# Patient Record
Sex: Female | Born: 1960 | Race: White | Hispanic: No | Marital: Married | Smoking: Never smoker
Health system: Southern US, Community
[De-identification: ages and names within clinical notes are randomized; demographics above are authoritative.]

## PROBLEM LIST (undated history)

## (undated) DIAGNOSIS — N83209 Unspecified ovarian cyst, unspecified side: Secondary | ICD-10-CM

## (undated) HISTORY — DX: Unspecified ovarian cyst, unspecified side: N83.209

## (undated) HISTORY — PX: EYE SURGERY: SHX253

---

## 2020-02-02 ENCOUNTER — Emergency Department: Payer: BLUE CROSS/BLUE SHIELD

## 2020-02-02 ENCOUNTER — Inpatient Hospital Stay
Admission: EM | Admit: 2020-02-02 | Discharge: 2020-02-03 | DRG: 392 | Disposition: A | Discharge status: Home / Self Care | Payer: BLUE CROSS/BLUE SHIELD | Attending: Obstetrics and Gynecology | Admitting: Obstetrics and Gynecology

## 2020-02-02 ENCOUNTER — Encounter: Payer: Self-pay | Admitting: Emergency Medicine

## 2020-02-02 ENCOUNTER — Other Ambulatory Visit: Payer: Self-pay

## 2020-02-02 DIAGNOSIS — Z20822 Contact with and (suspected) exposure to covid-19: Secondary | ICD-10-CM | POA: Diagnosis present

## 2020-02-02 DIAGNOSIS — M545 Low back pain: Secondary | ICD-10-CM | POA: Diagnosis not present

## 2020-02-02 DIAGNOSIS — N83209 Unspecified ovarian cyst, unspecified side: Secondary | ICD-10-CM | POA: Diagnosis present

## 2020-02-02 DIAGNOSIS — R1010 Upper abdominal pain, unspecified: Secondary | ICD-10-CM | POA: Diagnosis present

## 2020-02-02 DIAGNOSIS — R140 Abdominal distension (gaseous): Secondary | ICD-10-CM

## 2020-02-02 DIAGNOSIS — R1900 Intra-abdominal and pelvic swelling, mass and lump, unspecified site: Principal | ICD-10-CM | POA: Diagnosis present

## 2020-02-02 DIAGNOSIS — R109 Unspecified abdominal pain: Secondary | ICD-10-CM | POA: Diagnosis present

## 2020-02-02 LAB — URINALYSIS, COMPLETE (UACMP) WITH MICROSCOPIC
Bacteria, UA: NONE SEEN
Bilirubin Urine: NEGATIVE
Glucose, UA: NEGATIVE mg/dL
Hgb urine dipstick: NEGATIVE
Ketones, ur: 20 mg/dL — AB
Leukocytes,Ua: NEGATIVE
Nitrite: NEGATIVE
Protein, ur: NEGATIVE mg/dL
Specific Gravity, Urine: 1.012 (ref 1.005–1.030)
pH: 6 (ref 5.0–8.0)

## 2020-02-02 LAB — COMPREHENSIVE METABOLIC PANEL
ALT: 12 U/L (ref 0–44)
AST: 17 U/L (ref 15–41)
Albumin: 3.9 g/dL (ref 3.5–5.0)
Alkaline Phosphatase: 53 U/L (ref 38–126)
Anion gap: 9 (ref 5–15)
BUN: 12 mg/dL (ref 6–20)
CO2: 26 mmol/L (ref 22–32)
Calcium: 8.8 mg/dL — ABNORMAL LOW (ref 8.9–10.3)
Chloride: 103 mmol/L (ref 98–111)
Creatinine, Ser: 0.8 mg/dL (ref 0.44–1.00)
GFR calc Af Amer: >60 mL/min (ref >60)
GFR calc non Af Amer: >60 mL/min (ref >60)
Glucose, Bld: 132 mg/dL — ABNORMAL HIGH (ref 70–99)
Potassium: 4.3 mmol/L (ref 3.5–5.1)
Sodium: 138 mmol/L (ref 135–145)
Total Bilirubin: 0.9 mg/dL (ref 0.3–1.2)
Total Protein: 7.4 g/dL (ref 6.5–8.1)

## 2020-02-02 LAB — CBC
HCT: 43.1 % (ref 36.0–46.0)
Hemoglobin: 13.8 g/dL (ref 12.0–15.0)
MCH: 26.1 pg (ref 26.0–34.0)
MCHC: 32 g/dL (ref 30.0–36.0)
MCV: 81.5 fL (ref 80.0–100.0)
Platelets: 271 10*3/uL (ref 150–400)
RBC: 5.29 MIL/uL — ABNORMAL HIGH (ref 3.87–5.11)
RDW: 13.4 % (ref 11.5–15.5)
WBC: 7.8 10*3/uL (ref 4.0–10.5)
nRBC: 0 % (ref 0.0–0.2)

## 2020-02-02 LAB — PROTIME-INR
INR: 1.1 (ref 0.8–1.2)
Prothrombin Time: 13.6 seconds (ref 11.4–15.2)

## 2020-02-02 LAB — LIPASE, BLOOD: Lipase: 23 U/L (ref 11–51)

## 2020-02-02 LAB — AMMONIA: Ammonia: <9 umol/L — ABNORMAL LOW (ref 9–35)

## 2020-02-02 IMAGING — CT CT ABD-PELV W/ CM
2 of 5 series · 13 of 46 positions shown, 15 images · IV contrast (APPLIED)
Comparison: None.
COMPARISON: None.

Addendum:
CLINICAL DATA: Hip JIM nodes battery knee seen at circle

EXAM:
CT ABDOMEN AND PELVIS WITH CONTRAST
TECHNIQUE: Multidetector CT imaging of the abdomen and pelvis was performed
using the standard protocol following bolus administration of
intravenous contrast.
CONTRAST:  125mL OMNIPAQUE IOHEXOL 300 MG/ML  SOLN

[Series 3: routine abd/pel with · axial · 1.27mm/px · z∈[-686,-171]mm · 10 of 117 slices shown, 12 images]
[im 7/117  soft-tissue]
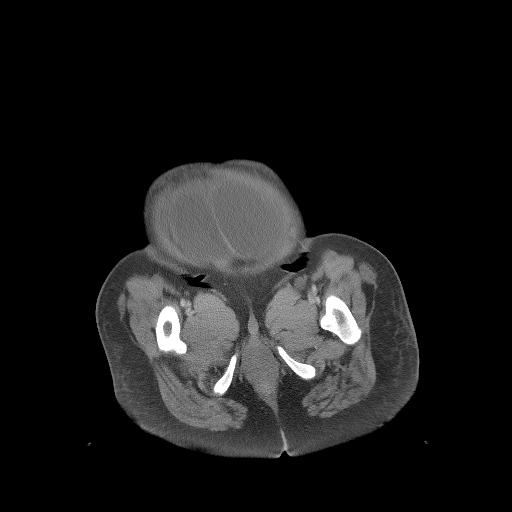
[im 7/117  bone]
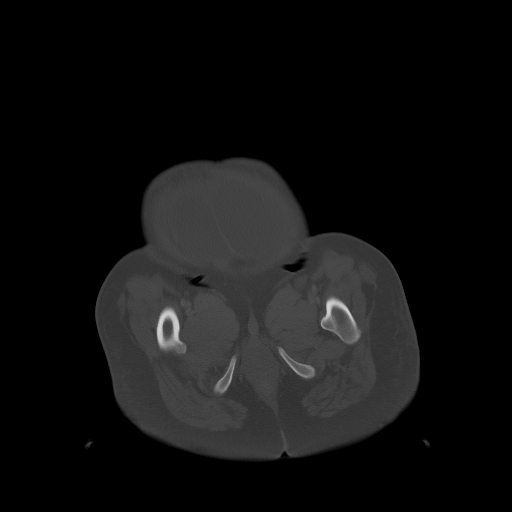
[im 19/117  soft-tissue]
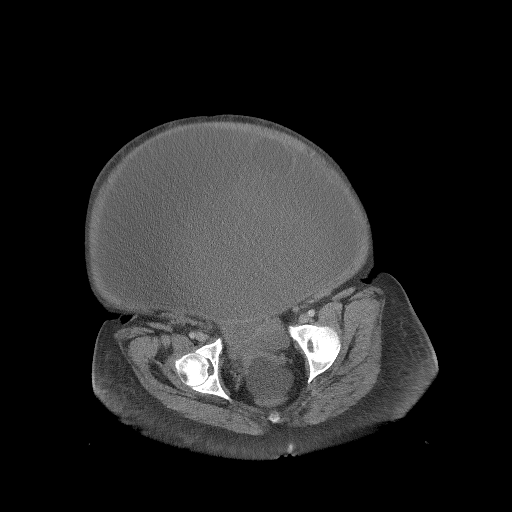
[im 31/117  soft-tissue]
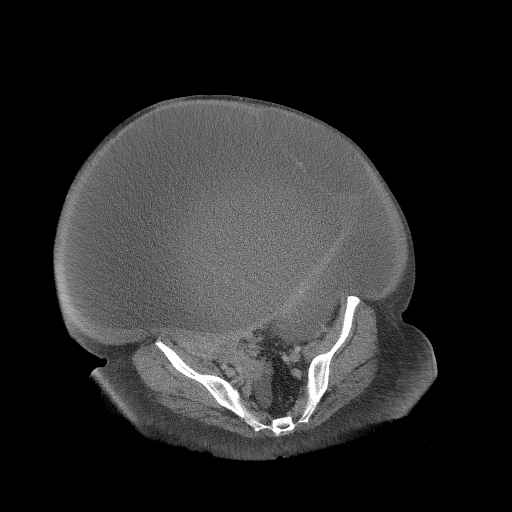
[im 43/117  soft-tissue]
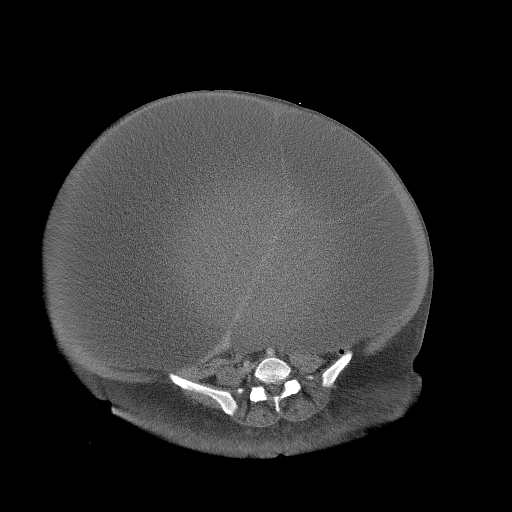
[im 55/117  soft-tissue]
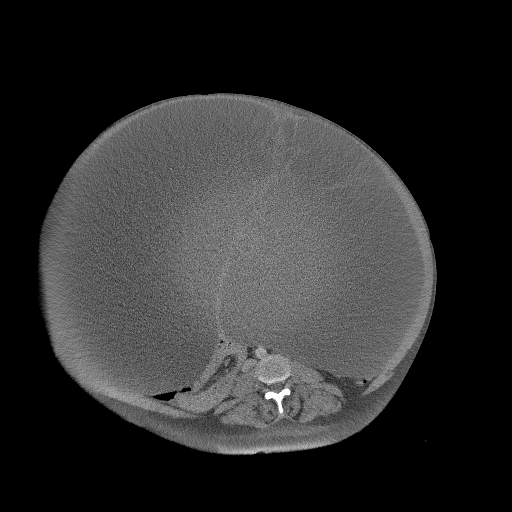
[im 62/117  soft-tissue]
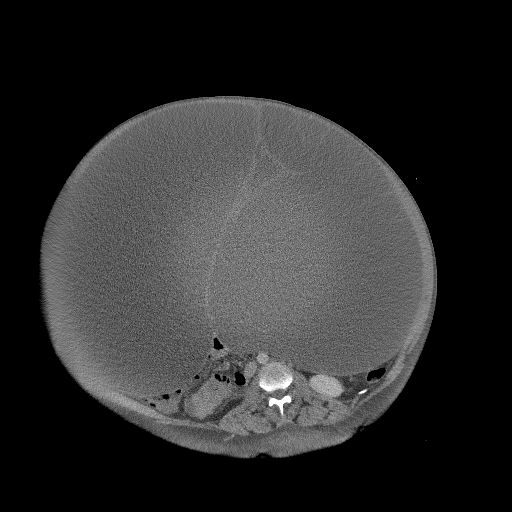
[im 74/117  soft-tissue]
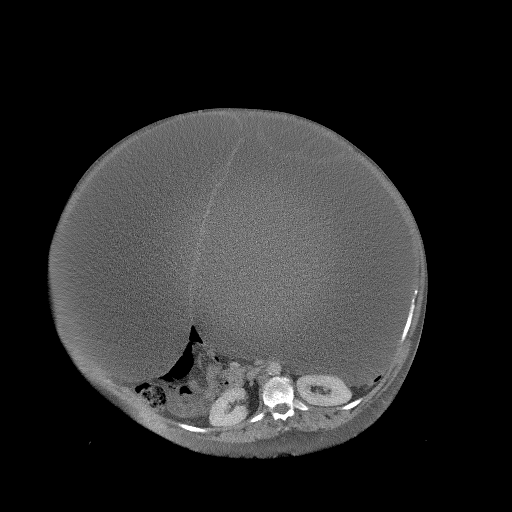
[im 86/117  soft-tissue]
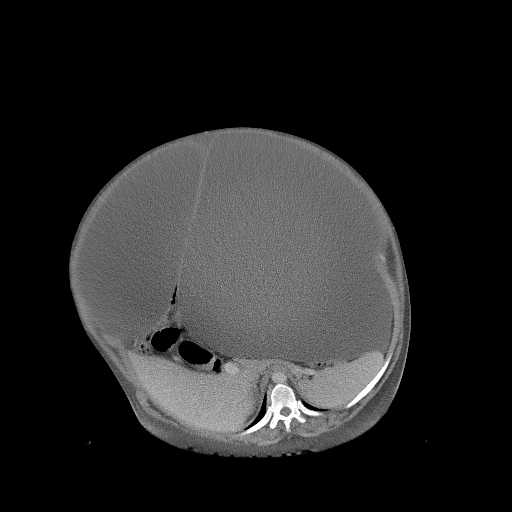
[im 98/117  soft-tissue]
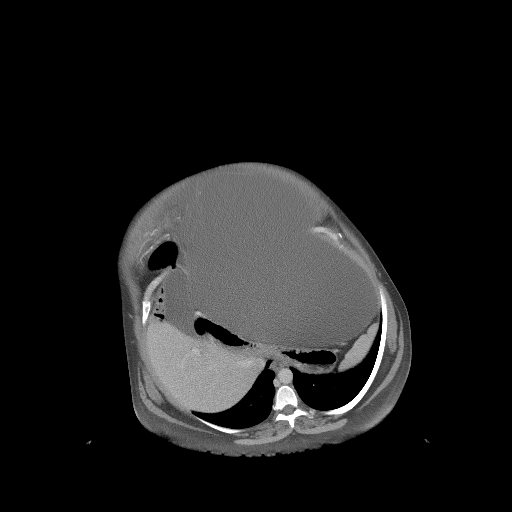
[im 98/117  bone]
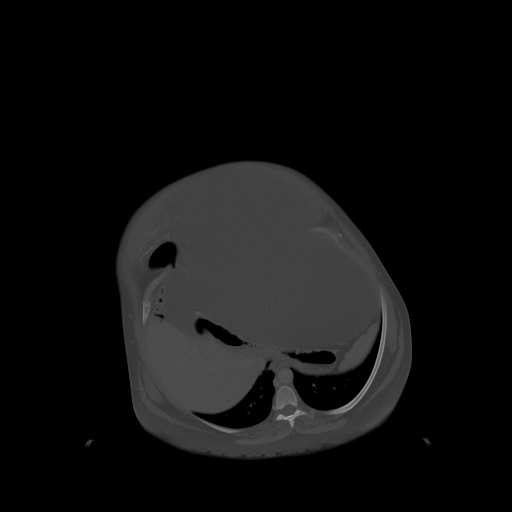
[im 110/117  soft-tissue]
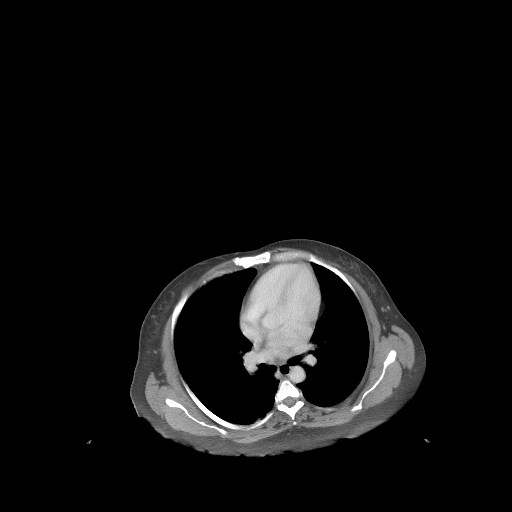

[Series 6: coronal st · coronal · 0.96mm/px · 3 of 156 slices shown]
[im 52/156  soft-tissue]
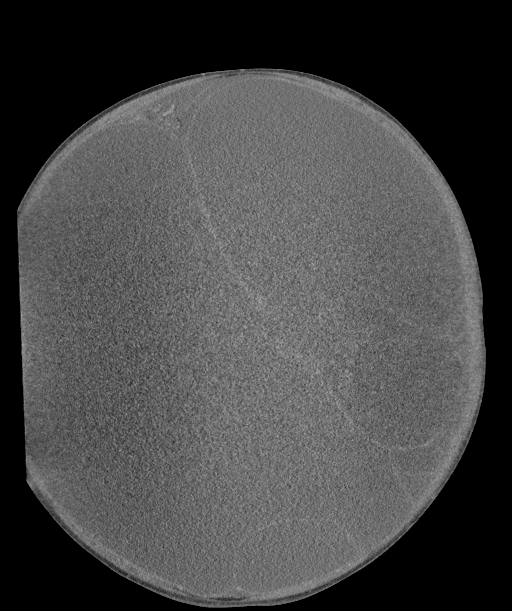
[im 69/156  soft-tissue]
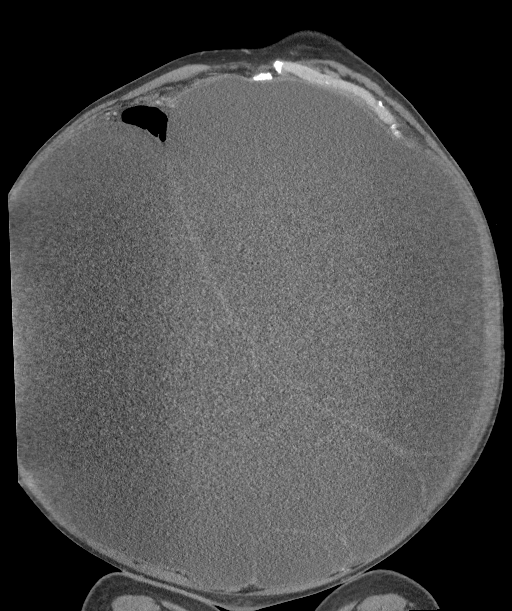
[im 87/156  soft-tissue]
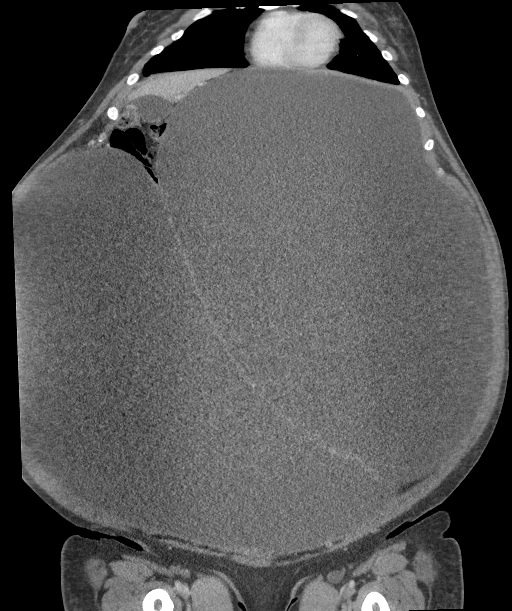

[13 of 46 positions shown; findings below may reference images not displayed]

FINDINGS: Lower chest: Lung bases are clear. Normal heart size. No pericardial
effusion.

Hepatobiliary: No focal liver abnormality is seen. No gallstones,
gallbladder wall thickening, or biliary dilatation. Displacement
posterolaterally by the large cystic appearing mass.

Pancreas: Difficult to visualize the pancreatic parenchyma no gross
abnormality is evident. No pancreatic ductal dilatation or
peripancreatic inflammation.

Spleen: Normal in size without focal abnormality.

Adrenals/Urinary Tract: Normal adrenal glands. No visible or contour
deforming renal lesions. No visible urolithiasis or hydronephrosis.
Bladder is largely decompressed at time of exam, compressed by the
large cystic mass occupying the abdominal cavity.

Stomach/Bowel: Suboptimal visualization of the bowel and mesentery
given displacement by the massive cystic mass occupying much of the
abdominal peritoneal compartment.

Vascular/Lymphatic: Atherosclerotic plaque within the normal caliber
aorta. No pathologically enlarged lymph nodes are clearly evident in
the abdomen or pelvis.

Reproductive: Anteverted uterus. Question a beaking appearance of
the large cystic mass occupying much of the abdominal compartment
directed towards the right adnexa, best seen on sagittal 7/78. Given
the size and appearance of this lesion, features are concerning for
large cystic ovarian neoplasm. Left ovary is difficult to visualize
as well.

Other: Massive multiloculated cystic structure occupying much of the
abdominal compartment measuring up to 50 by 44 by 37 cm in
craniocaudal by transverse by anterior posterior dimensions. Small
volume of low-attenuation fluid is noted in the deep pelvis. No
abdominopelvic free air. Mass causes marked abdominal distension.

Musculoskeletal: No acute osseous abnormality or suspicious osseous
lesion.
IMPRESSION: 1. Massive multiloculated cystic structure occupying much of the
abdominal compartment measuring up to 50 x 44 x 37 cm in
craniocaudal by transverse by anterior posterior dimensions. There
is questionable beaking towards the posteroinferior aspect directed
towards the right adnexa which favors an ovarian or tubal origin
though a primary peritoneal lesion could have a similar appearance,
less likely lymphatic malformation or other large cystic neoplasm.
2. Small volume of low-attenuation fluid in the deep pelvis.
3. Mass causes marked abdominal distension likely with compression
of the bowel loops and adjacent urinary bladder.
4. Aortic Atherosclerosis ([QY]-[QY]).

These results were called by telephone at the time of interpretation
on [DATE] at [DATE] to provider JIM , who verbally
acknowledged these results.

ADDENDUM:
Clinical data should read:

Left upper quadrant pain, severe abdominal distension gradually
increasing for years.

*** End of Addendum ***
FINDINGS: Lower chest: Lung bases are clear. Normal heart size. No pericardial
effusion.

Hepatobiliary: No focal liver abnormality is seen. No gallstones,
gallbladder wall thickening, or biliary dilatation. Displacement
posterolaterally by the large cystic appearing mass.

Pancreas: Difficult to visualize the pancreatic parenchyma no gross
abnormality is evident. No pancreatic ductal dilatation or
peripancreatic inflammation.

Spleen: Normal in size without focal abnormality.

Adrenals/Urinary Tract: Normal adrenal glands. No visible or contour
deforming renal lesions. No visible urolithiasis or hydronephrosis.
Bladder is largely decompressed at time of exam, compressed by the
large cystic mass occupying the abdominal cavity.

Stomach/Bowel: Suboptimal visualization of the bowel and mesentery
given displacement by the massive cystic mass occupying much of the
abdominal peritoneal compartment.

Vascular/Lymphatic: Atherosclerotic plaque within the normal caliber
aorta. No pathologically enlarged lymph nodes are clearly evident in
the abdomen or pelvis.

Reproductive: Anteverted uterus. Question a beaking appearance of
the large cystic mass occupying much of the abdominal compartment
directed towards the right adnexa, best seen on sagittal 7/78. Given
the size and appearance of this lesion, features are concerning for
large cystic ovarian neoplasm. Left ovary is difficult to visualize
as well.

Other: Massive multiloculated cystic structure occupying much of the
abdominal compartment measuring up to 50 by 44 by 37 cm in
craniocaudal by transverse by anterior posterior dimensions. Small
volume of low-attenuation fluid is noted in the deep pelvis. No
abdominopelvic free air. Mass causes marked abdominal distension.

Musculoskeletal: No acute osseous abnormality or suspicious osseous
lesion.
IMPRESSION: 1. Massive multiloculated cystic structure occupying much of the
abdominal compartment measuring up to 50 x 44 x 37 cm in
craniocaudal by transverse by anterior posterior dimensions. There
is questionable beaking towards the posteroinferior aspect directed
towards the right adnexa which favors an ovarian or tubal origin
though a primary peritoneal lesion could have a similar appearance,
less likely lymphatic malformation or other large cystic neoplasm.
2. Small volume of low-attenuation fluid in the deep pelvis.
3. Mass causes marked abdominal distension likely with compression
of the bowel loops and adjacent urinary bladder.
4. Aortic Atherosclerosis ([QY]-[QY]).

These results were called by telephone at the time of interpretation
on [DATE] at [DATE] to provider JIM , who verbally
acknowledged these results.

## 2020-02-02 MED ORDER — IOHEXOL 300 MG/ML  SOLN
125.0000 mL | Freq: Once | INTRAMUSCULAR | Status: AC | PRN
  Administered 2020-02-02: 125 mL via INTRAVENOUS

## 2020-02-02 NOTE — ED Provider Notes (Addendum)
[facility] Emergency Department Provider Note    First MD Initiated Contact with Patient 02/02/20 [phone]     (approximate)  I have reviewed the triage vital signs and the nursing notes.   HISTORY  Chief Complaint Abdominal Pain    HPI Rhonda Cantu is a 59 y.o. female presents to ER for evaluation of abdominal distention and pain.  States that she is been having some "weight gain "for several months to even years but started having worsening pain over the past day or 2 having difficulty with movement.  Patient is not seen a doctor in several years.  Denies any known illnesses.  She does not smoke denies any alcohol use.    History reviewed. No pertinent past medical history. History reviewed. No pertinent family history. History reviewed. No pertinent surgical history. There are no problems to display for this patient.     Prior to Admission medications   Not on File    Allergies Patient has no known allergies.    Social History Social History   Tobacco Use  . Smoking status: Never Smoker  . Smokeless tobacco: Never Used  Substance Use Topics  . Alcohol use: Never  . Drug use: Never    Review of Systems Patient denies headaches, rhinorrhea, blurry vision, numbness, shortness of breath, chest pain, edema, cough, abdominal pain, nausea, vomiting, diarrhea, dysuria, fevers, rashes or hallucinations unless otherwise stated above in HPI. ____________________________________________   PHYSICAL EXAM:  VITAL SIGNS: Vitals:   02/02/20 2127 02/02/20 2128  BP: (!) 166/104   Pulse:  (!) 109  Resp:    Temp:    SpO2:  100%    Constitutional: Alert and oriented.  Eyes: Conjunctivae are normal.  Head: Atraumatic. Nose: No congestion/rhinnorhea. Mouth/Throat: Mucous membranes are moist.   Neck: No stridor. Painless ROM.  Cardiovascular: Normal rate, regular rhythm. Grossly normal heart sounds.  Good peripheral circulation. Respiratory:  Normal respiratory effort.  No retractions. Lungs CTAB. Gastrointestinal: Soft and nontender. +++ distention. Positive fluid wave. No abdominal bruits. No CVA tenderness. Genitourinary:  Musculoskeletal: No lower extremity tenderness nor edema.  No joint effusions. Neurologic:  Normal speech and language. No gross focal neurologic deficits are appreciated. No facial droop Skin:  Skin is warm, dry and intact. No rash noted. Psychiatric: Mood and affect are normal. Speech and behavior are normal.  ____________________________________________   LABS (all labs ordered are listed, but only abnormal results are displayed)  Results for orders placed or performed during the hospital encounter of 02/02/20 (from the past 24 hour(s))  Lipase, blood     Status: None   Collection Time: 02/02/20  6:37 PM  Result Value Ref Range   Lipase 23 11 - 51 U/L  Comprehensive metabolic panel     Status: Abnormal   Collection Time: 02/02/20  6:37 PM  Result Value Ref Range   Sodium 138 135 - 145 mmol/L   Potassium 4.3 3.5 - 5.1 mmol/L   Chloride 103 98 - 111 mmol/L   CO2 26 22 - 32 mmol/L   Glucose, Bld 132 (H) 70 - 99 mg/dL   BUN 12 6 - 20 mg/dL   Creatinine, Ser 9.03 0.44 - 1.00 mg/dL   Calcium 8.8 (L) 8.9 - 10.3 mg/dL   Total Protein 7.4 6.5 - 8.1 g/dL   Albumin 3.9 3.5 - 5.0 g/dL   AST 17 15 - 41 U/L   ALT 12 0 - 44 U/L   Alkaline Phosphatase 53 38 - 126  U/L   Total Bilirubin 0.9 0.3 - 1.2 mg/dL   GFR calc non Af Amer >60 >60 mL/min   GFR calc Af Amer >60 >60 mL/min   Anion gap 9 5 - 15  CBC     Status: Abnormal   Collection Time: 02/02/20  6:37 PM  Result Value Ref Range   WBC 7.8 4.0 - 10.5 K/uL   RBC 5.29 (H) 3.87 - 5.11 MIL/uL   Hemoglobin 13.8 12.0 - 15.0 g/dL   HCT 43.1 36.0 - 46.0 %   MCV 81.5 80.0 - 100.0 fL   MCH 26.1 26.0 - 34.0 pg   MCHC 32.0 30.0 - 36.0 g/dL   RDW 13.4 11.5 - 15.5 %   Platelets 271 150 - 400 K/uL   nRBC 0.0 0.0 - 0.2 %  Ammonia     Status: Abnormal    Collection Time: 02/02/20  6:37 PM  Result Value Ref Range   Ammonia <9 (L) 9 - 35 umol/L  Urinalysis, Complete w Microscopic     Status: Abnormal   Collection Time: 02/02/20  9:29 PM  Result Value Ref Range   Color, Urine YELLOW (A) YELLOW   APPearance CLEAR (A) CLEAR   Specific Gravity, Urine 1.012 1.005 - 1.030   pH 6.0 5.0 - 8.0   Glucose, UA NEGATIVE NEGATIVE mg/dL   Hgb urine dipstick NEGATIVE NEGATIVE   Bilirubin Urine NEGATIVE NEGATIVE   Ketones, ur 20 (A) NEGATIVE mg/dL   Protein, ur NEGATIVE NEGATIVE mg/dL   Nitrite NEGATIVE NEGATIVE   Leukocytes,Ua NEGATIVE NEGATIVE   RBC / HPF 0-5 0 - 5 RBC/hpf   WBC, UA 6-10 0 - 5 WBC/hpf   Bacteria, UA NONE SEEN NONE SEEN   Squamous Epithelial / LPF 0-5 0 - 5   Mucus PRESENT   Protime-INR     Status: None   Collection Time: 02/02/20 10:41 PM  Result Value Ref Range   Prothrombin Time 13.6 11.4 - 15.2 seconds   INR 1.1 0.8 - 1.2   ____________________________________________  EKG My review and personal interpretation at Time: 23:32   Indication: abd distention  Rate: 105  Rhythm: sinus Axis: normal Other: normal intervals, no stemi ____________________________________________  RADIOLOGY  I personally reviewed all radiographic images ordered to evaluate for the above acute complaints and reviewed radiology reports and findings.  These findings were personally discussed with the patient.  Please see medical record for radiology report.  ____________________________________________   PROCEDURES  Procedure(s) performed:  Procedures    Critical Care performed: no ____________________________________________   INITIAL IMPRESSION / ASSESSMENT AND PLAN / ED COURSE  Pertinent labs & imaging results that were available during my care of the patient were reviewed by me and considered in my medical decision making (see chart for details).   DDX: Ascites, mass, SBO, volvulus, anasarca, failure  Rhonda Cantu is a 59 y.o.  who presents to the ED with presentation as described above.  Patient with massive distention of her abdomen seems to been steadily ongoing for the past months to years.  Blood work is otherwise reassuring.  She is nave to medical providers.  CT imaging was ordered which shows massive cystic structure causing the patient's distention.  Uncertain etiology but suspicious for ovarian but uncertain.  She denies any respiratory distress patient will require admission patient will be signed out to oncoming physician.     The patient was evaluated in Emergency Department today for the symptoms described in the history of present illness. He/she  was evaluated in the context of the global COVID-19 pandemic, which necessitated consideration that the patient might be at risk for infection with the SARS-CoV-2 virus that causes COVID-19. Institutional protocols and algorithms that pertain to the evaluation of patients at risk for COVID-19 are in a state of rapid change based on information released by regulatory bodies including the CDC and federal and state organizations. These policies and algorithms were followed during the patient's care in the ED.  As part of my medical decision making, I reviewed the following data within the electronic MEDICAL RECORD NUMBER Nursing notes reviewed and incorporated, Labs reviewed, notes from prior ED visits and [place] Controlled Substance Database   ____________________________________________   FINAL CLINICAL IMPRESSION(S) / ED DIAGNOSES  Final diagnoses:  Abdominal distension      NEW MEDICATIONS STARTED DURING THIS VISIT:  New Prescriptions   No medications on file     Note:  This document was prepared using Dragon voice recognition software and may include unintentional dictation errors.    Willy Eddy, MD 02/03/20 Mariann Laster    Willy Eddy, MD 02/03/20 [phone]

## 2020-02-02 NOTE — ED Triage Notes (Signed)
Pt presents to ED c/o LUQ abd pain, was seen at PCP office for same and referred to ED for severe and distention. Pt swelling has been gradually increasing for years, has not had PCP in many years. No medical hx, does not drink.

## 2020-02-02 NOTE — ED Notes (Signed)
Pt transported to CT ?

## 2020-02-03 ENCOUNTER — Other Ambulatory Visit: Payer: Self-pay

## 2020-02-03 ENCOUNTER — Encounter: Payer: Self-pay | Admitting: Obstetrics and Gynecology

## 2020-02-03 DIAGNOSIS — Z20822 Contact with and (suspected) exposure to covid-19: Secondary | ICD-10-CM | POA: Diagnosis present

## 2020-02-03 DIAGNOSIS — N83209 Unspecified ovarian cyst, unspecified side: Secondary | ICD-10-CM | POA: Diagnosis present

## 2020-02-03 DIAGNOSIS — R1010 Upper abdominal pain, unspecified: Secondary | ICD-10-CM | POA: Diagnosis present

## 2020-02-03 DIAGNOSIS — R1900 Intra-abdominal and pelvic swelling, mass and lump, unspecified site: Secondary | ICD-10-CM | POA: Diagnosis present

## 2020-02-03 DIAGNOSIS — R109 Unspecified abdominal pain: Secondary | ICD-10-CM | POA: Diagnosis present

## 2020-02-03 DIAGNOSIS — M545 Low back pain: Secondary | ICD-10-CM | POA: Diagnosis present

## 2020-02-03 LAB — RESPIRATORY PANEL BY RT PCR (FLU A&B, COVID)
Influenza A by PCR: NEGATIVE
Influenza B by PCR: NEGATIVE
SARS Coronavirus 2 by RT PCR: NEGATIVE

## 2020-02-03 MED ORDER — HYDROCODONE-ACETAMINOPHEN 5-325 MG PO TABS: 1.0000 | ORAL_TABLET | ORAL | 0 refills | Status: DC | PRN

## 2020-02-03 MED ORDER — HYDROCODONE-ACETAMINOPHEN 5-325 MG PO TABS: 1.0000 | ORAL_TABLET | ORAL | 0 refills | Status: AC | PRN

## 2020-02-03 MED ORDER — MORPHINE SULFATE (PF) 2 MG/ML IV SOLN
2.0000 mg | Freq: Once | INTRAVENOUS | Status: AC
  Administered 2020-02-03: 02:00:00 2 mg via INTRAVENOUS
  Filled 2020-02-03: qty 1

## 2020-02-03 MED ORDER — IBUPROFEN 600 MG PO TABS: 600.0000 mg | ORAL_TABLET | Freq: Four times a day (QID) | ORAL | Status: DC | PRN

## 2020-02-03 MED ORDER — HYDROCODONE-ACETAMINOPHEN 5-325 MG PO TABS
1.0000 | ORAL_TABLET | ORAL | Status: DC | PRN
  Administered 2020-02-03: 07:00:00 2 via ORAL
  Filled 2020-02-03: qty 2

## 2020-02-03 NOTE — Discharge Summary (Signed)
Pt seen and questions answered.  D/c home with norco and precautions. F/u for surgery on Wednesday.

## 2020-02-03 NOTE — Progress Notes (Signed)
Patient given discharge instructions. IV removed. Patient verbalized understanding of all instructions and when to return to hospital.

## 2020-02-03 NOTE — ED Provider Notes (Signed)
I assumed care of the patient from Dr. Roxan Hockey at 11:00 PM.  I discussed and reviewed the CT scan with the patient and her husband.  We also discussed admission at [facility] regional versus transfer to which the family agreed to be admitted here.  Patient was discussed with Dr. Dalbert Garnet OB/GYN on-call who will admit the patient for further evaluation and management.  Patient received IV morphine for pain control while in the emergency department with some pain improvement.     Darci Current, MD 02/03/20 0230

## 2020-02-03 NOTE — Plan of Care (Signed)
Help pt understanding of growth located in abd.

## 2020-02-03 NOTE — Progress Notes (Signed)
Consult History and Physical   SERVICE: Gynecology   Patient Name: Rhonda Cantu Patient MRN:   [id number]  CC: Upper abdominal pain  HPI: SHARINE CADLE is a 59 y.o. G0 with 2 days of mid-epigastric abdominal pain. On questioning, increasing diarrhea but no constipation, increasing GI upset with greasy foods. No change in bladder habits. No PMB. Minimal back pain, worsening recently. No SOB except when bending over.  However, on exam, her abdomein is remarkably distended; she "thought she was getting fat". She is 5'2". Several years of increasing abdominal girth, now unable to tie own shoes.   LMP in 40s, at least 10 yrs ago. Never pregnant, never tried.   No hx of HRT or OCP use; she remembers possibly in her early 63s taking OCPs, though she isn't sure.  No fhx of cancer, specifically denies breast, ovarian, uterine or colon cancers. Grandmother with ?cervical cancer, not treated, now in her 90s without concern.  Labs unremarkable in the ER, specifically LFTs, lipase, WBC, H/H, INR, BUN/Cr wnl.   Review of Systems: positives in bold GEN:   fevers, chills, weight changes, appetite changes, fatigue, night sweats HEENT:  HA, vision changes, hearing loss, congestion, rhinorrhea, sinus pressure, dysphagia CV:   CP, palpitations PULM:  SOB, cough GI:  abd pain, N/V/D/C GU:  dysuria, urgency, frequency MSK:  arthralgias, myalgias, back pain, swelling SKIN:  rashes, color changes, pallor NEURO:  numbness, weakness, tingling, seizures, dizziness, tremors PSYCH:  depression, anxiety, behavioral problems, confusion  HEME/LYMPH:  easy bruising or bleeding ENDO:  heat/cold intolerance  Past Obstetrical History: OB History   No obstetric history on file.     Past Gynecologic History: No LMP recorded. >8yrs ago  Past Medical History: History reviewed. No pertinent past medical history.  Past Surgical History:  History reviewed. No pertinent surgical history.  Family History:   Family history is unknown by patient.  Social History:  Social History   Socioeconomic History  . Marital status: Married    Spouse name: Revecca Nachtigal  . Number of children: 0  . Years of education: college  . Highest education level: Not on file  Occupational History  . Occupation: securatory  Tobacco Use  . Smoking status: Never Smoker  . Smokeless tobacco: Never Used  Substance and Sexual Activity  . Alcohol use: Not on file  . Drug use: Never  . Sexual activity: Not Currently    Partners: Male  Other Topics Concern  . Not on file  Social History Narrative  . Not on file   Social Determinants of Health   Financial Resource Strain:   . Difficulty of Paying Living Expenses:   Food Insecurity:   . Worried About Programme researcher, broadcasting/film/video in the Last Year:   . Barista in the Last Year:   Transportation Needs: No Transportation Needs  . Lack of Transportation (Medical): No  . Lack of Transportation (Non-Medical): No  Physical Activity:   . Days of Exercise per Week:   . Minutes of Exercise per Session:   Stress:   . Feeling of Stress :   Social Connections:   . Frequency of Communication with Friends and Family:   . Frequency of Social Gatherings with Friends and Family:   . Attends Religious Services:   . Active Member of Clubs or Organizations:   . Attends Banker Meetings:   Marland Kitchen Marital Status:   Intimate Partner Violence:   . Fear of Current or Ex-Partner:   .  Emotionally Abused:   Marland Kitchen Physically Abused:   . Sexually Abused:     Home Medications:  Medications reconciled in EPIC  No current facility-administered medications on file prior to encounter.   Current Outpatient Medications on File Prior to Encounter  Medication Sig Dispense Refill  . acetaminophen (TYLENOL) 325 MG tablet Take 650 mg by mouth every 6 (six) hours as needed.    Marland Kitchen ibuprofen (ADVIL) 200 MG tablet Take 200 mg by mouth every 6 (six) hours as needed.      Allergies:   Allergies  Allergen Reactions  . Shellfish Allergy Nausea And Vomiting    Physical Exam:  Temp:  [98 F (36.7 C)-98.7 F (37.1 C)] 98.2 F (36.8 C) (04/23 0757) Pulse Rate:  [98-111] 104 (04/23 0757) Resp:  [14-22] 14 (04/23 0757) BP: (118-177)/(70-104) 118/70 (04/23 0757) SpO2:  [95 %-100 %] 97 % (04/23 0757) Weight:  [122.5 kg] 122.5 kg (04/22 1833)   General Appearance:  Well developed, well nourished, no acute distress, alert and oriented x3 HEENT:  Normocephalic atraumatic, extraocular movements intact, moist mucous membranes Cardiovascular:  Normal S1/S2, regular rate and rhythm, no murmurs Pulmonary:  clear to auscultation, no wheezes, rales or rhonchi, symmetric air entry, good air exchange Abdomen:  Bowel sounds present, firm and distended with enormous distension to xyphoid. Not TTP, mobility unable to be assessed due to size.  Extremities:  Full range of motion, no pedal edema,  Skin:  normal coloration and turgor, no rashes, no suspicious skin lesions noted  Neurologic:  Cranial nerves 2-12 grossly intact,Psychiatric:  Normal mood and affect, appropriate, no AH/VH Pelvic:  NEFG, no vulvar masses or lesions, normal vaginal mucosa, no vaginal bleeding or discharge, cervix without palpable lesions. Uterus unable to be palpated, but no firm or irregular masses along vaginal walls.   Labs/Studies:   CBC and Coags:  Lab Results  Component Value Date   WBC 7.8 02/02/2020   HGB 13.8 02/02/2020   HCT 43.1 02/02/2020   MCV 81.5 02/02/2020   PLT 271 02/02/2020   INR 1.1 02/02/2020   CMP:  Lab Results  Component Value Date   NA 138 02/02/2020   K 4.3 02/02/2020   CL 103 02/02/2020   CO2 26 02/02/2020   BUN 12 02/02/2020   CREATININE 0.80 02/02/2020   PROT 7.4 02/02/2020   BILITOT 0.9 02/02/2020   ALT 12 02/02/2020   AST 17 02/02/2020   ALKPHOS 53 02/02/2020    Other Imaging: CT ABDOMEN PELVIS W CONTRAST  Result Date: 02/02/2020 CLINICAL DATA:  Hip Manu  nodes battery knee seen at circle EXAM: CT ABDOMEN AND PELVIS WITH CONTRAST TECHNIQUE: Multidetector CT imaging of the abdomen and pelvis was performed using the standard protocol following bolus administration of intravenous contrast. CONTRAST:  OMNIPAQUE IOHEXOL 300 MG/ML  SOLN COMPARISON:  None. FINDINGS: Lower chest: Lung bases are clear. Normal heart size. No pericardial effusion. Hepatobiliary: No focal liver abnormality is seen. No gallstones, gallbladder wall thickening, or biliary dilatation. Displacement posterolaterally by the large cystic appearing mass. Pancreas: Difficult to visualize the pancreatic parenchyma no gross abnormality is evident. No pancreatic ductal dilatation or peripancreatic inflammation. Spleen: Normal in size without focal abnormality. Adrenals/Urinary Tract: Normal adrenal glands. No visible or contour deforming renal lesions. No visible urolithiasis or hydronephrosis. Bladder is largely decompressed at time of exam, compressed by the large cystic mass occupying the abdominal cavity. Stomach/Bowel: Suboptimal visualization of the bowel and mesentery given displacement by the massive cystic mass occupying much  of the abdominal peritoneal compartment. Vascular/Lymphatic: Atherosclerotic plaque within the normal caliber aorta. No pathologically enlarged lymph nodes are clearly evident in the abdomen or pelvis. Reproductive: Anteverted uterus. Question a beaking appearance of the large cystic mass occupying much of the abdominal compartment directed towards the right adnexa, best seen on sagittal 7/78. Given the size and appearance of this lesion, features are concerning for large cystic ovarian neoplasm. Left ovary is difficult to visualize as well. Other: Massive multiloculated cystic structure occupying much of the abdominal compartment measuring up to 50 by 44 by 37 cm in craniocaudal by transverse by anterior posterior dimensions. Small volume of low-attenuation fluid is  noted in the deep pelvis. No abdominopelvic free air. Mass causes marked abdominal distension. Musculoskeletal: No acute osseous abnormality or suspicious osseous lesion. IMPRESSION: 1. Massive multiloculated cystic structure occupying much of the abdominal compartment measuring up to 50 x 44 x 37 cm in craniocaudal by transverse by anterior posterior dimensions. There is questionable beaking towards the posteroinferior aspect directed towards the right adnexa which favors an ovarian or tubal origin though a primary peritoneal lesion could have a similar appearance, less likely lymphatic malformation or other large cystic neoplasm. 2. Small volume of low-attenuation fluid in the deep pelvis. 3. Mass causes marked abdominal distension likely with compression of the bowel loops and adjacent urinary bladder. 4. Aortic Atherosclerosis (ICD10-I70.0). These results were called by telephone at the time of interpretation on 02/02/2020 at 11:44 pm to provider Merlyn Lot , who verbally acknowledged these results. Electronically Signed   By: Lovena Le M.D.   On: 02/02/2020 23:44     Assessment / Plan:   LOUCILLE TAKACH is a 59 y.o. F with large, multicystic abdominal mass with ground glass appearance throughout.  1. Surgical removal recommended. Will discuss with gyn onc about add-on to Wednesday's schedule. Possible minimally invasive drainage with oophorectomy.   Norco for pain control Regular diet for now F/u in office Monday.

## 2020-02-03 NOTE — H&P (Signed)
Rhonda Cantu is an 59 y.o. female who presents with abdominal and lower left back pain for the last 2 days  Pertinent Gynecological History: Menses: post-menopausal Bleeding: Denies any postmenopausal bleeding   Social History:  reports that she has never smoked. She has never used smokeless tobacco. She reports that she does not drink alcohol or use drugs.  Allergies: No Known Allergies    Review of Systems  Constitutional: Positive for unexpected weight change.  Respiratory: Negative.   Cardiovascular: Negative.   Gastrointestinal: Positive for abdominal pain.  Genitourinary: Negative.   Musculoskeletal: Positive for back pain.  Neurological: Negative.   Psychiatric/Behavioral: Negative.     Blood pressure (!) 177/99, pulse (!) 111, temperature 98 F (36.7 C), temperature source Oral, resp. rate 20, height 5\' 2"  (1.575 m), weight 122.5 kg, SpO2 100 %. Physical Exam  Constitutional: She is oriented to person, place, and time.  Cardiovascular: Normal rate.  Respiratory: Effort normal.  GI: She exhibits distension and mass.  Neurological: She is alert and oriented to person, place, and time.  Skin: Skin is warm and dry.  Psychiatric: She has a normal mood and affect. Her behavior is normal.    Results for orders placed or performed during the hospital encounter of 02/02/20 (from the past 24 hour(s))  Lipase, blood     Status: None   Collection Time: 02/02/20  6:37 PM  Result Value Ref Range   Lipase 23 11 - 51 U/L  Comprehensive metabolic panel     Status: Abnormal   Collection Time: 02/02/20  6:37 PM  Result Value Ref Range   Sodium 138 135 - 145 mmol/L   Potassium 4.3 3.5 - 5.1 mmol/L   Chloride 103 98 - 111 mmol/L   CO2 26 22 - 32 mmol/L   Glucose, Bld 132 (H) 70 - 99 mg/dL   BUN 12 6 - 20 mg/dL   Creatinine, Ser 02/04/20 0.44 - 1.00 mg/dL   Calcium 8.8 (L) 8.9 - 10.3 mg/dL   Total Protein 7.4 6.5 - 8.1 g/dL   Albumin 3.9 3.5 - 5.0 g/dL   AST 17 15 - 41 U/L   ALT 12 0 - 44 U/L   Alkaline Phosphatase 53 38 - 126 U/L   Total Bilirubin 0.9 0.3 - 1.2 mg/dL   GFR calc non Af Amer >60 >60 mL/min   GFR calc Af Amer >60 >60 mL/min   Anion gap 9 5 - 15  CBC     Status: Abnormal   Collection Time: 02/02/20  6:37 PM  Result Value Ref Range   WBC 7.8 4.0 - 10.5 K/uL   RBC 5.29 (H) 3.87 - 5.11 MIL/uL   Hemoglobin 13.8 12.0 - 15.0 g/dL   HCT 02/04/20 78.4 - 69.6 %   MCV 81.5 80.0 - 100.0 fL   MCH 26.1 26.0 - 34.0 pg   MCHC 32.0 30.0 - 36.0 g/dL   RDW 29.5 28.4 - 13.2 %   Platelets 271 150 - 400 K/uL   nRBC 0.0 0.0 - 0.2 %  Ammonia     Status: Abnormal   Collection Time: 02/02/20  6:37 PM  Result Value Ref Range   Ammonia <9 (L) 9 - 35 umol/L  Urinalysis, Complete w Microscopic     Status: Abnormal   Collection Time: 02/02/20  9:29 PM  Result Value Ref Range   Color, Urine YELLOW (A) YELLOW   APPearance CLEAR (A) CLEAR   Specific Gravity, Urine 1.012 1.005 - 1.030  pH 6.0 5.0 - 8.0   Glucose, UA NEGATIVE NEGATIVE mg/dL   Hgb urine dipstick NEGATIVE NEGATIVE   Bilirubin Urine NEGATIVE NEGATIVE   Ketones, ur 20 (A) NEGATIVE mg/dL   Protein, ur NEGATIVE NEGATIVE mg/dL   Nitrite NEGATIVE NEGATIVE   Leukocytes,Ua NEGATIVE NEGATIVE   RBC / HPF 0-5 0 - 5 RBC/hpf   WBC, UA 6-10 0 - 5 WBC/hpf   Bacteria, UA NONE SEEN NONE SEEN   Squamous Epithelial / LPF 0-5 0 - 5   Mucus PRESENT   Protime-INR     Status: None   Collection Time: 02/02/20 10:41 PM  Result Value Ref Range   Prothrombin Time 13.6 11.4 - 15.2 seconds   INR 1.1 0.8 - 1.2    CT ABDOMEN PELVIS W CONTRAST  Result Date: 02/02/2020 CLINICAL DATA:  Hip Manu nodes battery knee seen at circle EXAM: CT ABDOMEN AND PELVIS WITH CONTRAST TECHNIQUE: Multidetector CT imaging of the abdomen and pelvis was performed using the standard protocol following bolus administration of intravenous contrast. CONTRAST:  OMNIPAQUE IOHEXOL 300 MG/ML  SOLN COMPARISON:  None. FINDINGS: Lower chest: Lung bases  are clear. Normal heart size. No pericardial effusion. Hepatobiliary: No focal liver abnormality is seen. No gallstones, gallbladder wall thickening, or biliary dilatation. Displacement posterolaterally by the large cystic appearing mass. Pancreas: Difficult to visualize the pancreatic parenchyma no gross abnormality is evident. No pancreatic ductal dilatation or peripancreatic inflammation. Spleen: Normal in size without focal abnormality. Adrenals/Urinary Tract: Normal adrenal glands. No visible or contour deforming renal lesions. No visible urolithiasis or hydronephrosis. Bladder is largely decompressed at time of exam, compressed by the large cystic mass occupying the abdominal cavity. Stomach/Bowel: Suboptimal visualization of the bowel and mesentery given displacement by the massive cystic mass occupying much of the abdominal peritoneal compartment. Vascular/Lymphatic: Atherosclerotic plaque within the normal caliber aorta. No pathologically enlarged lymph nodes are clearly evident in the abdomen or pelvis. Reproductive: Anteverted uterus. Question a beaking appearance of the large cystic mass occupying much of the abdominal compartment directed towards the right adnexa, best seen on sagittal 7/78. Given the size and appearance of this lesion, features are concerning for large cystic ovarian neoplasm. Left ovary is difficult to visualize as well. Other: Massive multiloculated cystic structure occupying much of the abdominal compartment measuring up to 50 by 44 by 37 cm in craniocaudal by transverse by anterior posterior dimensions. Small volume of low-attenuation fluid is noted in the deep pelvis. No abdominopelvic free air. Mass causes marked abdominal distension. Musculoskeletal: No acute osseous abnormality or suspicious osseous lesion. IMPRESSION: 1. Massive multiloculated cystic structure occupying much of the abdominal compartment measuring up to 50 x 44 x 37 cm in craniocaudal by transverse by anterior  posterior dimensions. There is questionable beaking towards the posteroinferior aspect directed towards the right adnexa which favors an ovarian or tubal origin though a primary peritoneal lesion could have a similar appearance, less likely lymphatic malformation or other large cystic neoplasm. 2. Small volume of low-attenuation fluid in the deep pelvis. 3. Mass causes marked abdominal distension likely with compression of the bowel loops and adjacent urinary bladder. 4. Aortic Atherosclerosis (ICD10-I70.0). These results were called by telephone at the time of interpretation on 02/02/2020 at 11:44 pm to provider Willy Eddy , who verbally acknowledged these results. Electronically Signed   By: Kreg Shropshire M.D.   On: 02/02/2020 23:44    Assessment/Plan: Per Dr. Dalbert Garnet - Admit to Med-Surg floor for pain management, she will meet with  pt in the am to discuss her options. Pain management with Motrin and Norco alternating   Jenifer E Keirstan Iannello  02/03/20 2:31 AM  02/03/2020, 2:23 AM

## 2020-02-03 NOTE — Discharge Instructions (Signed)
Call my office with any questions: Shirlyn Goltz [phone]  You will have surgery on Wednesday, and will need covid screening on Monday. Preop nurses will call you to discuss your surgery and what to expect the day before surgery.   [facility] Monday to get another covid swab. You will stay in the car. They do swabs between 8:30-10:00 am.  On Wednesday, we will anticipate keeping you for several days after surgery to be sure you're ok. There will be two surgeons in the case: me and Dr. Evelena Asa, who is a gyn oncologist at [facility] who is coming to assist in case anything concerning shows up during surgery.  If your pain returns between now and then and you are worried, please go to the [facility] ER in [place], [place]. Call the number above and let me know.  Eat lots of protein between now and then and have pasta or other carbs the night before your surgery.  Thank you!

## 2020-02-06 ENCOUNTER — Telehealth: Payer: Self-pay

## 2020-02-06 DIAGNOSIS — N83209 Unspecified ovarian cyst, unspecified side: Secondary | ICD-10-CM

## 2020-02-06 NOTE — Telephone Encounter (Signed)
Received notification this am that surgery for 4/28 has been cancelled and Ms. Outten needs to be seen in gyn onc clinic on 4/28. Call placed to Ms. Lecomte regarding. She was unaware of surgery being cancelled. Explained the reasoning she needs to be seen gyn onc. Appointment arranged for 4/28 at 0830. Directions to the cancer center given. Instructed to bring picture ID and any insurance she may have. Read back performed.

## 2020-02-06 NOTE — Telephone Encounter (Signed)
Message sent to [facility]yn onc at 1230 at request of Ms. Wickes to see if she could be seen there before 4/28. Message acknowledged. Awaiting response to see if they have any openings.

## 2020-02-06 NOTE — Telephone Encounter (Signed)
Error

## 2020-02-07 ENCOUNTER — Other Ambulatory Visit: Admission: RE | Admit: 2020-02-07 | Payer: BLUE CROSS/BLUE SHIELD | Source: Ambulatory Visit

## 2020-02-07 ENCOUNTER — Telehealth: Payer: Self-pay

## 2020-02-07 ENCOUNTER — Other Ambulatory Visit: Payer: BLUE CROSS/BLUE SHIELD

## 2020-02-07 NOTE — Telephone Encounter (Signed)
Notified that insurance is out of [facility] network. We will keep appointment open here at [facility] for consult. Voicemail left with Rhonda Cantu to ensure she was notified.

## 2020-02-08 ENCOUNTER — Encounter: Payer: Self-pay | Admitting: Obstetrics and Gynecology

## 2020-02-08 ENCOUNTER — Other Ambulatory Visit: Payer: Self-pay

## 2020-02-08 ENCOUNTER — Inpatient Hospital Stay: Payer: BLUE CROSS/BLUE SHIELD | Attending: Obstetrics and Gynecology | Admitting: Obstetrics and Gynecology

## 2020-02-08 ENCOUNTER — Inpatient Hospital Stay: Payer: BLUE CROSS/BLUE SHIELD

## 2020-02-08 ENCOUNTER — Inpatient Hospital Stay: Admit: 2020-02-08 | Payer: BLUE CROSS/BLUE SHIELD | Admitting: Obstetrics and Gynecology

## 2020-02-08 VITALS — BP 137/95 | HR 117 | Temp 97.1°F | Resp 20 | Wt 267.0 lb

## 2020-02-08 DIAGNOSIS — R1900 Intra-abdominal and pelvic swelling, mass and lump, unspecified site: Secondary | ICD-10-CM | POA: Diagnosis not present

## 2020-02-08 SURGERY — LAPAROSCOPY OPERATIVE
Anesthesia: Choice

## 2020-02-08 NOTE — Progress Notes (Signed)
Gynecologic Oncology Consult Visit   Referring Provider: Christeen Douglas, MD  Chief Concern: Large abdominopelvic mass  Subjective:  Rhonda Cantu is a 59 y.o. G0P0 female who is seen in consultation from Dr. Dalbert Garnet for abdominal pain and 50cm abdominopelvic mass.   She presented to the ER on 02/02/2020 with worsening pain. She had noted increasing girth for years (uncertain of time period) and she thought she was gaining weight. She had not seen a primary care doctor in decades. She has not had screening mammograms, colonoscopy, or Pap smears.   CT A/P 02/02/2020 Reproductive: Anteverted uterus. Question a beaking appearance of the large cystic mass occupying much of the abdominal compartment directed towards the right adnexa, best seen on sagittal 7/78. Given the size and appearance of this lesion, features are concerning for large cystic ovarian neoplasm. Left ovary is difficult to visualize as well.  Other: Massive multiloculated cystic structure occupying much of the abdominal compartment measuring up to 50 by 44 by 37 cm in craniocaudal by transverse by anterior posterior dimensions. Small volume of low-attenuation fluid is noted in the deep pelvis. No abdominopelvic free air. Mass causes marked abdominal distension.  Musculoskeletal: No acute osseous abnormality or suspicious osseous lesion.  IMPRESSION: 1. Massive multiloculated cystic structure occupying much of the abdominal compartment measuring up to 50 x 44 x 37 cm in craniocaudal by transverse by anterior posterior dimensions. There is questionable beaking towards the posteroinferior aspect directed towards the right adnexa which favors an ovarian or tubal origin though a primary peritoneal lesion could have a similar appearance, less likely lymphatic malformation or other large cystic neoplasm. 2. Small volume of low-attenuation fluid in the deep pelvis. 3. Mass causes marked abdominal distension likely with  compression of the bowel loops and adjacent urinary bladder. 4. Aortic Atherosclerosis (ICD10-I70.0).  Please see ROS for pertinent negatives and positive symptoms.   She presents for discussion of surgical management. She is concerned about covering her medical payments and relates there has been confusing regarding her coverage. She has [employer].    Problem List: Patient Active Problem List   Diagnosis Date Noted  . Abdominal mass 02/08/2020  . Ovarian cyst 02/03/2020    Past Medical History: No known medical history.   Past Surgical History: Past Surgical History:  Procedure Laterality Date  . EYE SURGERY      Past Gynecologic History:  Menarche: 12 Menstrual details: Lasts 5 days Menses regular: regular Last Menstrual Period: uncertain maybe 59 years old History of Abnormal pap: No Last pap: Unknown  Sexually active: no not currently  OB History:  OB History  Gravida Para Term Preterm AB Living  0 0 0 0 0 0  SAB TAB Ectopic Multiple Live Births  0 0 0 0 0    Family History: Family History  Problem Relation Age of Onset  . Cervical cancer Mother   . Hypertension Mother   . Diabetes Father   . Kidney disease Father   . Kidney disease Paternal Uncle     Social History: Social History   Socioeconomic History  . Marital status: Married    Spouse name: Rhonda Cantu  . Number of children: 0  . Years of education: college degree  . Highest education level: Not on file  Occupational History  . 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 Smoking status: Never Smoker  . Smokeless tobacco: Never  Used  Substance and Sexual Activity  . Alcohol use: Never  . Drug use: Never  . Sexual activity: Not Currently    Partners: Male  Other Topics Concern  . Not on file  Social History Narrative  . Not on file   Social Determinants of Health   Financial  Resource Strain:   . Difficulty of Paying Living Expenses:   Food Insecurity:   . Worried About Programme researcher, broadcasting/film/video in the Last Year:   . Barista in the Last Year:   Transportation Needs: No Transportation Needs  . Lack of Transportation (Medical): No  . Lack of Transportation (Non-Medical): No  Physical Activity:   . Days of Exercise per Week:   . Minutes of Exercise per Session:   Stress:   . Feeling of Stress :   Social Connections:   . Frequency of Communication with Friends and Family:   . Frequency of Social Gatherings with Friends and Family:   . Attends Religious Services:   . Active Member of Clubs or Organizations:   . Attends Banker Meetings:   Marland Kitchen Marital Status:   Intimate Partner Violence:   . Fear of Current or Ex-Partner:   . Emotionally Abused:   Marland Kitchen Physically Abused:   . Sexually Abused:     Allergies: Allergies  Allergen Reactions  . Shellfish Allergy Nausea And Vomiting  . Broccoli [Brassica Oleracea] Nausea And Vomiting    Current Medications: Current Outpatient Medications  Medication Sig Dispense Refill  . acetaminophen (TYLENOL) 325 MG tablet Take 650 mg by mouth every 6 (six) hours as needed.    . calcium carbonate (TUMS - DOSED IN MG ELEMENTAL CALCIUM) 500 MG chewable tablet Chew 1 tablet by mouth daily as needed for indigestion or heartburn.    Marland Kitchen HYDROcodone-acetaminophen (NORCO/VICODIN) 5-325 MG tablet Take 1 tablet by mouth every 4 (four) hours as needed for moderate pain. 20 tablet 0  . ibuprofen (ADVIL) 200 MG tablet Take 400 mg by mouth every 6 (six) hours as needed for moderate pain.      No current facility-administered medications for this visit.    Review of Systems General:  weight gain and decreased appetite; negative for fever/night sweats. Skin: negative for changes in moles or sores or rash Eyes: negative for changes in vision HEENT: negative for change in hearing, tinnitus, voice changes Pulmonary:dyspnea,  orthopnea due to the mass; she is unable to lay flat and she also has pain with deep breathing so she is unable to take deep breaths; negative for  productive cough, wheezing Cardiac: negative for palpitations, pain Gastrointestinal: abdominal discomfort, nausea occasionally and early satiety; negative for vomiting, constipation, diarrhea, hematemesis, hematochezia. She denies any change in stool caliber.  Genitourinary/Sexual: negative for dysuria, retention, hematuria, incontinence Ob/Gyn:  negative for abnormal bleeding, or pain Musculoskeletal: left upper back pain new onset since her ER visit: negative for pain elsewhere, joint pain Hematology: negative for easy bruising, abnormal bleeding Neurologic/Psych: negative for headaches, seizures, paralysis, weakness, numbness  Objective:  Physical Examination:  BP (!) 137/95 (BP Location: Right Arm, Patient Position: Sitting)   Pulse (!) 117   Temp (!) 97.1 F (36.2 C) (Tympanic)   Resp 20   Wt 267 lb (121.1 kg)   SpO2 98%   BMI 48.83 kg/m   ECOG Performance Status: 2 - Symptomatic, <50% confined to bed  GENERAL: Patient is a well appearing female in no acute distress HEENT:  PERRL, neck supple with midline  trachea.   NODES:  No cervical, supraclavicular, axillary, or inguinal lymphadenopathy palpated.  LUNGS:  Clear to auscultation bilaterally.  No wheezes or rhonchi. HEART:  Increased heart rate but regular.  ABDOMEN:  Soft, nontender and no RGR.  Positive for large abdominal-pelvic mass as noted in photo; Positive caput medusa-type appearance. No ascites.  MSK:  No focal spinal tenderness to palpation. + LCVAT mild. Full range of motion bilaterally in the upper extremities. EXTREMITIES:  Positive bilateral 1+ edema. No cords; no calf tenderness.  SKIN:  Clear with no obvious rashes or skin changes. No nail dyscrasia. NEURO:  Nonfocal. Well oriented.  Appropriate affect.    Pelvic: chaperoned by CMA;  Vulva: normal appearing  vulva with no masses, tenderness or lesions; Vagina: normal vagina but not able to see the apex due to the mass; Adnexa: large mass filling the pelvis and extending into the upper abdomen. The mass feels smooth to palpation; no nodularity but nonmobile; Uterus: unable to determine size or to distinguish from the mass; Cervix: unable to visualize on speculum due to the mass. On palpation no obvious abnormalities but very limited exam. Rectal: confirmatory, no apparent involvement with the rectum; mucosa is smooth; not gross blood. Guaiac card not available.    Lab Review Lab Results  Component Value Date   WBC 7.8 02/02/2020   HGB 13.8 02/02/2020   HCT 43.1 02/02/2020   MCV 81.5 02/02/2020   PLT 271 02/02/2020     Chemistry      Component Value Date/Time   NA 138 02/02/2020 1837   K 4.3 02/02/2020 1837   CL 103 02/02/2020 1837   CO2 26 02/02/2020 1837   BUN 12 02/02/2020 1837   CREATININE 0.80 02/02/2020 1837      Component Value Date/Time   CALCIUM 8.8 (L) 02/02/2020 1837   ALKPHOS 53 02/02/2020 1837   AST 17 02/02/2020 1837   ALT 12 02/02/2020 1837   BILITOT 0.9 02/02/2020 1837     Albumin 3.9  Urine: + ketones o/w negative protein, bacteria, WBC, leukocytes   Radiologic Imaging: As noted in HPI         Reviewed imaging with radiology and per their verbal report no evidence of blood clot involvement of the IVC, common/internal/external iliac vessels.  Assessment:  Rhonda Cantu is a 59 y.o. female diagnosed with large pelvic mass. Differential diagnosis includes mucinous primary (either benign or malignant) as well as other etiologies benign ovarian cyst (serous or other histologies), ovarian malignancy, or metastatic disease.   Abdominal pain and left flank pain most likely due to the mass; No evidence of infection on urinalysis.   No medical issues, but limited health care.   Bilateral edema - suspect due to the mass; no evidence of VTE on CT imaging.    Performance status 2.   Sinus tachycardia, asymptomatic.  Medical co-morbidities complicating care: None Plan:   Problem List Items Addressed This Visit      Other   Abdominal mass - Primary   Relevant Orders   CEA   CA 125      We discussed options for management including surgery. Unfortunately she does not have insurance coverage for [facility] and I am concerned that the level of care required and perioperative risk is sufficient to warrant care at a tertiary hospital. They are also concerned that they may not have insurance coverage for care at [facility].  I contacted [facility] and spoke with Dr. Kyla Balzarine and he is happy to see her  this Friday in clinic for evaluation.   The patient and her husband are okay with obtaining the CEA, and CA125 today so Dr. Clarene Essex will have this information for his evaluation. She did not have chest imaging and she will need this preoperatively. She may need  Preop anesthesia consult, assistance from pulmonary team postoperatively given potential for respiratory compromise, and Plastic Surgery evaluation for abdominal wall reconstruction after the mass is removed.   Bilateral edema - suspect due to the mass; no evidence of VTE on CT imaging based on our review with radiology. Discussed with patient. If worsens or becomes assymetrical obtain BLE dopplers.    Performance status: her performance status is worsening due to the mass and limited ability to move. I encouraged her to keep moving. Even if more sedentary to do exercises even in the chair to enhance strength in preparation for surgery.   Sinus tachycardia, asymptomatic, most likely due to discomfort from the mass. O2 saturations appear adequate. We are always concerned about VTE event and if findings worsen or increasing SOB obtain Chest CT to assess for PE.   Discontinue NSAIDs due to anticipation for surgery.   Suggested return to clinic as needed. We are happy to assist with postoperative care. I also  encouraged her to seek preventive health care and establish a PCP for screening tests if her pathology is negative for malignancy.    The patient's diagnosis, an outline of the further diagnostic and laboratory studies which will be required, the recommendation, and alternatives were discussed.  All questions were answered to the patient's satisfaction.  A total of 90 minutes were spent with the patient/family today; >50% was spent in education, counseling and coordination of care for abdominal/pelvic mass.    Rhonda Hamric Gaetana Michaelis, MD   CC:  Dr. Benjaman Kindler Dr. Cindie Laroche

## 2020-02-08 NOTE — Progress Notes (Signed)
Appointment has been arranged with Dr. Kyla Balzarine for this Friday at 0800. [facility] will contact her for directions and new patient packet. Escorted to the lab for CEA/CA 125.

## 2020-02-08 NOTE — Progress Notes (Signed)
Pt not able to sleep because of pressure of her stomach and her back at below shoulder area on left at times. She has to sit up for any kind of comfort.. she does not go to the doctor and she has anixety when she has to come. Right now she is not hurting and she was able to get some sleep

## 2020-02-09 LAB — CEA: CEA: 1.3 ng/mL (ref 0.0–4.7)

## 2020-02-09 LAB — CA 125: Cancer Antigen (CA) 125: 102 U/mL — ABNORMAL HIGH (ref 0.0–38.1)

## 2020-02-15 NOTE — Progress Notes (Signed)
PSN called patient this morning.  According to family member, patient is currently recovering from surgery.  PSN will call patient, again, after she has recovered.

## 2020-02-18 MED ORDER — GENERIC EXTERNAL MEDICATION
10.00 | Status: DC
Start: ? — End: 2020-02-18

## 2020-02-18 MED ORDER — SIMETHICONE 80 MG PO CHEW
80.00 | CHEWABLE_TABLET | ORAL | Status: DC
Start: ? — End: 2020-02-18

## 2020-02-18 MED ORDER — ENOXAPARIN SODIUM 40 MG/0.4ML ~~LOC~~ SOLN
40.00 | SUBCUTANEOUS | Status: DC
Start: 2020-02-19 — End: 2020-02-18

## 2020-02-18 MED ORDER — MORPHINE SULFATE 2 MG/ML IJ SOLN
2.00 | INTRAMUSCULAR | Status: DC
Start: ? — End: 2020-02-18

## 2020-02-18 MED ORDER — IBUPROFEN 200 MG PO TABS
600.00 | ORAL_TABLET | ORAL | Status: DC
Start: 2020-02-18 — End: 2020-02-18

## 2020-02-18 MED ORDER — ACETAMINOPHEN 325 MG PO TABS
650.00 | ORAL_TABLET | ORAL | Status: DC
Start: 2020-02-18 — End: 2020-02-18

## 2020-02-18 MED ORDER — POLYETHYLENE GLYCOL 3350 17 GM/SCOOP PO POWD
17.00 | ORAL | Status: DC
Start: 2020-02-19 — End: 2020-02-18

## 2020-02-18 MED ORDER — SENNOSIDES 8.6 MG PO TABS
2.00 | ORAL_TABLET | ORAL | Status: DC
Start: 2020-02-18 — End: 2020-02-18

## 2020-02-18 MED ORDER — ALUM & MAG HYDROXIDE-SIMETH 400-400-40 MG/5ML PO SUSP
30.00 | ORAL | Status: DC
Start: ? — End: 2020-02-18

## 2020-02-18 MED ORDER — PHENOL 1.4 % MT LIQD
2.00 | OROMUCOSAL | Status: DC
Start: ? — End: 2020-02-18

## 2020-02-18 MED ORDER — DEXTROSE-SODIUM CHLORIDE 5-0.45 % IV SOLN
75.00 | INTRAVENOUS | Status: DC
Start: ? — End: 2020-02-18

## 2020-02-18 MED ORDER — SODIUM CHLORIDE 0.9 % IV SOLN
INTRAVENOUS | Status: DC
Start: ? — End: 2020-02-18

## 2020-02-18 MED ORDER — PREGABALIN 100 MG PO CAPS
100.00 | ORAL_CAPSULE | ORAL | Status: DC
Start: 2020-02-18 — End: 2020-02-18

## 2020-02-18 MED ORDER — DSS 100 MG PO CAPS
100.00 | ORAL_CAPSULE | ORAL | Status: DC
Start: 2020-02-18 — End: 2020-02-18

## 2020-02-18 MED ORDER — MAGNESIUM OXIDE 400 MG PO TABS
400.00 | ORAL_TABLET | ORAL | Status: DC
Start: 2020-02-19 — End: 2020-02-18

## 2020-02-18 MED ORDER — GENERIC EXTERNAL MEDICATION
Status: DC
Start: ? — End: 2020-02-18

## 2020-02-18 MED ORDER — LIDOCAINE VISCOUS HCL 2 % MT SOLN
15.00 | OROMUCOSAL | Status: DC
Start: ? — End: 2020-02-18

## 2022-11-13 DIAGNOSIS — Z419 Encounter for procedure for purposes other than remedying health state, unspecified: Secondary | ICD-10-CM | POA: Diagnosis not present

## 2022-12-12 DIAGNOSIS — Z419 Encounter for procedure for purposes other than remedying health state, unspecified: Secondary | ICD-10-CM | POA: Diagnosis not present

## 2023-01-12 DIAGNOSIS — Z419 Encounter for procedure for purposes other than remedying health state, unspecified: Secondary | ICD-10-CM | POA: Diagnosis not present

## 2023-02-11 DIAGNOSIS — Z419 Encounter for procedure for purposes other than remedying health state, unspecified: Secondary | ICD-10-CM | POA: Diagnosis not present

## 2023-03-14 DIAGNOSIS — Z419 Encounter for procedure for purposes other than remedying health state, unspecified: Secondary | ICD-10-CM | POA: Diagnosis not present

## 2023-04-13 DIAGNOSIS — Z419 Encounter for procedure for purposes other than remedying health state, unspecified: Secondary | ICD-10-CM | POA: Diagnosis not present

## 2023-05-14 DIAGNOSIS — Z419 Encounter for procedure for purposes other than remedying health state, unspecified: Secondary | ICD-10-CM | POA: Diagnosis not present

## 2023-06-14 DIAGNOSIS — Z419 Encounter for procedure for purposes other than remedying health state, unspecified: Secondary | ICD-10-CM | POA: Diagnosis not present

## 2023-07-14 DIAGNOSIS — Z419 Encounter for procedure for purposes other than remedying health state, unspecified: Secondary | ICD-10-CM | POA: Diagnosis not present

## 2023-08-14 DIAGNOSIS — Z419 Encounter for procedure for purposes other than remedying health state, unspecified: Secondary | ICD-10-CM | POA: Diagnosis not present

## 2023-09-13 DIAGNOSIS — Z419 Encounter for procedure for purposes other than remedying health state, unspecified: Secondary | ICD-10-CM | POA: Diagnosis not present

## 2023-10-14 DIAGNOSIS — Z419 Encounter for procedure for purposes other than remedying health state, unspecified: Secondary | ICD-10-CM | POA: Diagnosis not present

## 2023-11-14 DIAGNOSIS — Z419 Encounter for procedure for purposes other than remedying health state, unspecified: Secondary | ICD-10-CM | POA: Diagnosis not present

## 2023-12-12 DIAGNOSIS — Z419 Encounter for procedure for purposes other than remedying health state, unspecified: Secondary | ICD-10-CM | POA: Diagnosis not present

## 2024-01-23 DIAGNOSIS — Z419 Encounter for procedure for purposes other than remedying health state, unspecified: Secondary | ICD-10-CM | POA: Diagnosis not present

## 2024-02-22 DIAGNOSIS — Z419 Encounter for procedure for purposes other than remedying health state, unspecified: Secondary | ICD-10-CM | POA: Diagnosis not present

## 2024-03-24 DIAGNOSIS — Z419 Encounter for procedure for purposes other than remedying health state, unspecified: Secondary | ICD-10-CM | POA: Diagnosis not present

## 2024-04-23 DIAGNOSIS — Z419 Encounter for procedure for purposes other than remedying health state, unspecified: Secondary | ICD-10-CM | POA: Diagnosis not present

## 2024-05-24 DIAGNOSIS — Z419 Encounter for procedure for purposes other than remedying health state, unspecified: Secondary | ICD-10-CM | POA: Diagnosis not present

## 2024-06-24 DIAGNOSIS — Z419 Encounter for procedure for purposes other than remedying health state, unspecified: Secondary | ICD-10-CM | POA: Diagnosis not present

## 2024-09-23 DIAGNOSIS — Z419 Encounter for procedure for purposes other than remedying health state, unspecified: Secondary | ICD-10-CM | POA: Diagnosis not present
